# Patient Record
Sex: Female | Born: 2000 | Race: Black or African American | Hispanic: No | Marital: Single | Smoking: Never smoker
Health system: Southern US, Community
[De-identification: ages and names within clinical notes are randomized; demographics above are authoritative.]

## PROBLEM LIST (undated history)

## (undated) DIAGNOSIS — H5060 Mechanical strabismus, unspecified: Secondary | ICD-10-CM

---
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
## 2021-11-05 ENCOUNTER — Other Ambulatory Visit: Payer: Self-pay

## 2021-11-05 ENCOUNTER — Emergency Department (HOSPITAL_BASED_OUTPATIENT_CLINIC_OR_DEPARTMENT_OTHER)
Admission: EM | Admit: 2021-11-05 | Discharge: 2021-11-05 | Disposition: A | Discharge status: Home / Self Care | Payer: No Typology Code available for payment source | Attending: Emergency Medicine | Admitting: Emergency Medicine

## 2021-11-05 ENCOUNTER — Encounter (HOSPITAL_BASED_OUTPATIENT_CLINIC_OR_DEPARTMENT_OTHER): Payer: Self-pay | Admitting: Emergency Medicine

## 2021-11-05 DIAGNOSIS — M940 Chondrocostal junction syndrome [Tietze]: Secondary | ICD-10-CM

## 2021-11-05 DIAGNOSIS — R0781 Pleurodynia: Secondary | ICD-10-CM | POA: Diagnosis present

## 2021-11-05 DIAGNOSIS — M546 Pain in thoracic spine: Secondary | ICD-10-CM | POA: Diagnosis not present

## 2021-11-05 DIAGNOSIS — R079 Chest pain, unspecified: Secondary | ICD-10-CM

## 2021-11-05 MED ORDER — METHYLPREDNISOLONE 4 MG PO TBPK: ORAL_TABLET | ORAL | 0 refills | Status: AC

## 2021-11-05 NOTE — ED Provider Notes (Signed)
[facility] Provider Note   CSN: [id number] Arrival date & time: 11/05/21  1236     History  Chief Complaint  Patient presents with   thoracic pain    Right     Courtney [name] is a 21 y.o. female who presents emergency department complaining of right-sided thoracic pain for the past 3 weeks.  Patient was recently seen by her PCP where they discussed her symptoms.  They recommended ibuprofen, and she has been taking this with some relief.  Patient states that when she woke up this morning she sneezed, and started severely hurting in her right side.  She also heard a "pop" at that time.  Denies shortness of breath, cough, other chest pain, leg pain or swelling. Mother reports patient is very active and works out frequently.   HPI     Home Medications Prior to Admission medications   Medication Sig Start Date End Date Taking? Authorizing Provider  methylPREDNISolone (MEDROL DOSEPAK) 4 MG TBPK tablet Take per package instructions 11/05/21  Yes Acire Tang T, PA-C  amitriptyline (ELAVIL) 25 MG tablet Take 1 tablet (25 mg total) by mouth at bedtime. 08/22/15   Teressa Lower, MD  cetirizine (ZYRTEC) 10 MG tablet Take 10 mg by mouth as needed for allergies.    [provider]  Magnesium Oxide 500 MG TABS Take by mouth.    [provider]  riboflavin (VITAMIN B-2) 100 MG TABS tablet Take 100 mg by mouth daily.    [provider]      Allergies    Patient has no known allergies.    Review of Systems   Review of Systems  Constitutional:  Negative for chills and fever.  Respiratory:  Negative for cough and shortness of breath.   Cardiovascular:  Negative for chest pain, palpitations and leg swelling.  Gastrointestinal:  Negative for abdominal pain.  Musculoskeletal:        Chest wall pain  All other systems reviewed and are negative.  Physical Exam Updated Vital Signs BP 124/75 (BP Location: Right Arm)   Pulse 87    Temp 98.3 F (36.8 C) (Oral)   Resp 16   Ht 5\' 7"  (1.702 m)   Wt 65.8 kg   SpO2 100%   BMI 22.71 kg/m  Physical Exam Vitals and nursing note reviewed.  Constitutional:      Appearance: Normal appearance.  HENT:     Head: Normocephalic and atraumatic.  Eyes:     Conjunctiva/sclera: Conjunctivae normal.  Cardiovascular:     Rate and Rhythm: Normal rate and regular rhythm.  Pulmonary:     Effort: Pulmonary effort is normal. No respiratory distress.     Breath sounds: Normal breath sounds.  Chest:     Comments: Reproducible right sided anterior chest wall pain, also pleuritic Abdominal:     General: There is no distension.     Palpations: Abdomen is soft.     Tenderness: There is no abdominal tenderness.  Skin:    General: Skin is warm and dry.  Neurological:     General: No focal deficit present.     Mental Status: She is alert.    ED Results / Procedures / Treatments   Labs (all labs ordered are listed, but only abnormal results are displayed) Labs Reviewed - No data to display  EKG None  Radiology No results found.  Procedures Procedures    Medications Ordered in ED Medications - No data to display  ED  Course/ Medical Decision Making/ A&P                           Medical Decision Making Risk Prescription drug management.   This patient is a 21 y.o. female who presents to the ED for concern of right sided thoracic pain.   Differential diagnoses prior to evaluation: PE, pneumothorax, esophageal spasm, valvular disease, pneumonia, bronchitis, biliary disease, costochondritis, anxiety or panic attack, breast disorders  Past Medical History / Co-morbidities / Social History: No significant PMH  Additional history: Chart reviewed. Pertinent results include: Patient seen at PCP on 5/18 for similar symptoms, provider had concern for dense breast tissue versus costochondritis.  They recommended she take ibuprofen and follow-up as needed.  Physical  Exam: Physical exam performed. The pertinent findings include: Reproducible tenderness palpation of the right anterior chest wall.  Normal vital signs, and clear lung sounds.   Disposition: After consideration of the diagnostic results and the patients response to treatment, I feel that patient is not requiring admission. Based on the pattern of her symptoms, I think she likely has costochondritis. She is PERC negative for PE and I doubt this is related to pulmonary etiology. Will recommend continuing NSAIDs and give short course of steroids. Recommended following up with PCP. Discussed reasons to return to the emergency department, and the patient is agreeable to the plan.         Final Clinical Impression(s) / ED Diagnoses Final diagnoses:  Costochondritis  Right-sided chest pain    Rx / DC Orders ED Discharge Orders          Ordered    methylPREDNISolone (MEDROL DOSEPAK) 4 MG TBPK tablet        11/05/21 1455           Portions of this report may have been transcribed using voice recognition software. Every effort was made to ensure accuracy; however, inadvertent computerized transcription errors may be present.    Courtney [name] 11/05/21 1542    Courtney Pence, MD 11/06/21 [phone]

## 2021-11-05 NOTE — ED Triage Notes (Signed)
Right thoracic pain x 3 weeks , seen by pcp . Today started severely hurting after she sneezed.

## 2021-11-05 NOTE — Discharge Instructions (Addendum)
You were seen in the emergency department for right sided pain.  I think your pain is likely related to something called costochondritis, this is inflammation of the cartilage around your ribs. I am prescribing you a short course of steroids which should work as a strong anti-inflammatory. You can take 600-800 mg of ibuprofen every 6 hours as needed as well.  Continue to monitor how you're doing and return to the ER for new or worsening symptoms.
